# Patient Record
Sex: Female | Born: 1945 | Race: Black or African American | Hispanic: No | Marital: Married | Smoking: Never smoker
Health system: Southern US, Community
[De-identification: ages and names within clinical notes are randomized; demographics above are authoritative.]

## PROBLEM LIST (undated history)

## (undated) DIAGNOSIS — C539 Malignant neoplasm of cervix uteri, unspecified: Secondary | ICD-10-CM

## (undated) DIAGNOSIS — E119 Type 2 diabetes mellitus without complications: Secondary | ICD-10-CM

## (undated) DIAGNOSIS — I10 Essential (primary) hypertension: Secondary | ICD-10-CM

## (undated) DIAGNOSIS — C50919 Malignant neoplasm of unspecified site of unspecified female breast: Secondary | ICD-10-CM

## (undated) HISTORY — PX: MASTECTOMY: SHX3

## (undated) HISTORY — DX: Type 2 diabetes mellitus without complications: E11.9

## (undated) HISTORY — DX: Malignant neoplasm of cervix uteri, unspecified: C53.9

## (undated) HISTORY — DX: Essential (primary) hypertension: I10

## (undated) HISTORY — DX: Malignant neoplasm of unspecified site of unspecified female breast: C50.919

## (undated) HISTORY — DX: Morbid (severe) obesity due to excess calories: E66.01

---

## 2014-08-08 ENCOUNTER — Encounter: Payer: Self-pay | Admitting: Gynecologic Oncology

## 2014-08-08 ENCOUNTER — Ambulatory Visit: Payer: Commercial Managed Care - HMO | Attending: Gynecologic Oncology | Admitting: Gynecologic Oncology

## 2014-08-08 VITALS — BP 169/78 | HR 78 | Temp 98.2°F | Resp 22 | Ht 63.0 in | Wt 262.1 lb

## 2014-08-08 DIAGNOSIS — C50919 Malignant neoplasm of unspecified site of unspecified female breast: Secondary | ICD-10-CM | POA: Diagnosis not present

## 2014-08-08 DIAGNOSIS — Z794 Long term (current) use of insulin: Secondary | ICD-10-CM | POA: Diagnosis not present

## 2014-08-08 DIAGNOSIS — Z6841 Body Mass Index (BMI) 40.0 and over, adult: Secondary | ICD-10-CM | POA: Insufficient documentation

## 2014-08-08 DIAGNOSIS — C539 Malignant neoplasm of cervix uteri, unspecified: Secondary | ICD-10-CM | POA: Insufficient documentation

## 2014-08-08 DIAGNOSIS — Z9010 Acquired absence of unspecified breast and nipple: Secondary | ICD-10-CM | POA: Insufficient documentation

## 2014-08-08 DIAGNOSIS — C50912 Malignant neoplasm of unspecified site of left female breast: Secondary | ICD-10-CM

## 2014-08-08 DIAGNOSIS — C7982 Secondary malignant neoplasm of genital organs: Secondary | ICD-10-CM | POA: Insufficient documentation

## 2014-08-08 DIAGNOSIS — E119 Type 2 diabetes mellitus without complications: Secondary | ICD-10-CM | POA: Diagnosis not present

## 2014-08-08 DIAGNOSIS — I10 Essential (primary) hypertension: Secondary | ICD-10-CM | POA: Insufficient documentation

## 2014-08-08 DIAGNOSIS — Z7982 Long term (current) use of aspirin: Secondary | ICD-10-CM | POA: Insufficient documentation

## 2014-08-08 NOTE — Progress Notes (Signed)
Consult Note: Gyn-Onc  Consult was requested by Dr. Dion Body for the evaluation of Kim Valenzuela 69 y.o. female with cervical squamous cell carcinoma.  CC:  Chief Complaint  Patient presents with  . Cervical Cancer    Assessment/Plan:  Kim Valenzuela  is a 69 y.o.  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  I believe that Kim Valenzuela is too obese to be a good candidate for a radical hysterectomy procedure. I discussed with her that same possibility for cure can be obtained from primary chemoradiation. We discussed the likely course of external beam radiation with radiosensitizing cisplatin followed by brachytherapy. Complicating her presentation is her coexisting diagnosis of either recurrent or new second primary breast cancer. We will liase with her medical oncologist in [place] to coordinate the optimal care plan see that both of these synchronously diagnosed cancers can be treated in the optimal time.  I have ordered a PET CT to evaluate for distant metastatic disease of her cervical cancer (breast cancer).   HPI: Kim Valenzuela is a 70 year old G2 P2 who is seen in consultation at the request of Dr.Abdul-Mbacke for finding of squamous cell carcinoma on a Pap smear that was obtained on 07/18/2014. The patient reports vaginal spotting since December 2015. She is not sexually active. She denies edema, vaginal discharge, pelvic pain. Her last Pap was between 5-10 years ago. She reports no history of an abnormal Pap smear remotely in the past.  Dr.Abdul-Mbacke performed a pelvic examination which revealed some tissue extruding from the cervical os. A Pap smear was collected. Revealed squamous cell carcinoma. Of note an endometrial stripe was evaluated on ultrasound scan on 07/18/2014 and was thickened at 10 mm.   Interval History: She has been diagnosed with recurrence of her breast cancer/new primary and is seeing oncologists in  [place].  Current Meds:  Outpatient Encounter Prescriptions as of 08/08/2014  Medication Sig  . amLODipine (NORVASC) 10 MG tablet Take 10 mg by mouth daily.  Marland Kitchen aspirin 81 MG tablet Take 81 mg by mouth daily.  . carvedilol (COREG) 6.25 MG tablet Take 6.25 mg by mouth 2 (two) times daily with a meal.  . ergocalciferol (VITAMIN D2) 50000 UNITS capsule Take 50,000 Units by mouth once a week.  . ferrous gluconate (FERGON) 240 (27 FE) MG tablet Take 240 mg by mouth daily.  . furosemide (LASIX) 20 MG tablet Take 20 mg by mouth.  . insulin lispro protamine-lispro (HUMALOG 75/25 MIX) (75-25) 100 UNIT/ML SUSP injection Inject 50 Units into the skin 2 (two) times daily with a meal.  . losartan (COZAAR) 100 MG tablet Take 100 mg by mouth daily.  . nateglinide (STARLIX) 120 MG tablet Take 120 mg by mouth 3 (three) times daily with meals.  . pravastatin (PRAVACHOL) 20 MG tablet Take 20 mg by mouth daily.    Allergy: Not on File  Social Hx:   History   Social History  . Marital Status: Married    Spouse Name: N/A  . Number of Children: N/A  . Years of Education: N/A   Occupational History  . Not on file.   Social History Main Topics  . Smoking status: Never Smoker   . Smokeless tobacco: Not on file  . Alcohol Use: No  . Drug Use: No  . Sexual Activity: Not on file   Other Topics Concern  . Not on file   Social History Narrative  . No narrative on file  Past Surgical Hx:  Past Surgical History  Procedure Laterality Date  . Mastectomy    . Cesarean section      Past Medical Hx:  Past Medical History  Diagnosis Date  . Breast cancer   . Cervical cancer   . Diabetes mellitus without complication   . Hypertension   . Obesity, Class III, BMI 40-49.9 (morbid obesity)     Past Gynecological History:  No LMP recorded. cx x2  Family Hx:  Family History  Problem Relation Age of Onset  . Cancer Mother     Review of Systems:  Constitutional  Feels well,     ENT Normal appearing ears and nares bilaterally Skin/Breast  No rash, sores, jaundice, itching, dryness Cardiovascular  No chest pain, shortness of breath, or edema  Pulmonary  No cough or wheeze.  Gastro Intestinal  No nausea, vomitting, or diarrhoea. No bright red blood per rectum, no abdominal pain, change in bowel movement, or constipation.  Genito Urinary  No frequency, urgency, dysuria, + vaginal spotting Musculo Skeletal  No myalgia, arthralgia, joint swelling or pain  Neurologic  No weakness, numbness, change in gait,  Psychology  No depression, anxiety, insomnia.   Vitals:  Blood pressure 169/78, pulse 78, temperature 98.2 F (36.8 C), temperature source Oral, resp. rate 22, height 5\' 3"  (1.6 m), weight 262 lb 1.6 oz (118.888 kg).  Physical Exam: WD in NAD Neck  Supple NROM, without any enlargements.  Lymph Node Survey No cervical supraclavicular or inguinal adenopathy Cardiovascular  Pulse normal rate, regularity and rhythm. S1 and S2 normal.  Lungs  Clear to auscultation bilateraly, without wheezes/crackles/rhonchi. Good air movement.  Skin  No rash/lesions/breakdown  Psychiatry  Alert and oriented to person, place, and time  Abdomen  Normoactive bowel sounds, abdomen soft, non-tender and obese without evidence of hernia.  Back No CVA tenderness Genito Urinary  Vulva/vagina: Normal external female genitalia.  No lesions. No discharge or bleeding.  Bladder/urethra:  No lesions or masses, well supported bladder  Vagina: no lesions or abnormalities  Cervix: smooth ectocervix, however the os is open with tissue extruding - cervical biopsy taken of this tissue. The cervix itself barrel's out to a 4cm lesion. It is mobile without apparent parametrial extension.  Uterus:  Small, mobile, no parametrial involvement or nodularity.  Adnexa: no palpable masses. Rectal  Good tone, no masses no cul de sac nodularity. No parametrial/uterosacral nodularity. Extremities   No bilateral cyanosis, clubbing or edema.  Donaciano Eva, MD   08/08/2014, 4:18 PM

## 2014-08-10 NOTE — Progress Notes (Signed)
The pathology report from the endocervical biopsy was resulted today and revealed SEROUS ENDOMETRIAL CANCER.  I called Kim Valenzuela and her treating oncologist, Dr Lorn Junes [phone]) and informed her that she has a clinical stage II serous endometrial cancer.  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 Postoperatively she can then begin planned chemotherapy for her breast cancer. If we recommend adjuvant radiation based on her uterine pathology findings, that can be contemplated after her chemotherapy for her breast cancer, however, given that she will require Adriamycin for treatment of her breast cancer, there may be toxicity issues with the addition of radiation.  While her tumor may be a stage II (involving endocervix) there is no gross replacement of cervical stroma. I believe it is reasonable to attempt an extrafascial hysterectomy or modified "type II" radical hysterectomy. It will be associated with increased risks in this patient due to her morbid obesity, however I believe it is more feasible than a radical hysterectomy for this very obese patient. I am not able to accommodate her on my operating room schedule here in [place] until April, however, due to her synchronous breast cancer also requiring urgent treatment, we will pursue surgical options with my partners in [place] to expedite her therapy.  Everitt Amber, MD

## 2014-08-11 ENCOUNTER — Encounter: Payer: Self-pay | Admitting: Gynecologic Oncology

## 2014-08-11 ENCOUNTER — Ambulatory Visit (HOSPITAL_COMMUNITY): Payer: 59

## 2014-08-11 DIAGNOSIS — Z981 Arthrodesis status: Secondary | ICD-10-CM

## 2014-08-11 DIAGNOSIS — Z419 Encounter for procedure for purposes other than remedying health state, unspecified: Secondary | ICD-10-CM

## 2014-08-11 NOTE — Progress Notes (Signed)
Referral was made to [facility] for Radiation Oncology when the patient was seen in the office on Friday, Feb. 22.  Due to the biopsy results, radiation no longer recommended.  [facility] notified this am that referral needed to be cancelled.  Received a phone call back from [place] at [place] that the appt had been cancelled.

## 2014-08-11 NOTE — Progress Notes (Signed)
Spoke with Katharine Look at [facility].  Patient is scheduled for surgery with Dr. Clarene Essex at [facility] on March 4, Friday.  She is to call the patient to give her a pre-operative appt as well.

## 2014-08-16 ENCOUNTER — Encounter (HOSPITAL_COMMUNITY)
Admission: RE | Admit: 2014-08-16 | Discharge: 2014-08-16 | Disposition: A | Discharge status: Home / Self Care | Payer: 59 | Source: Ambulatory Visit | Attending: Gynecologic Oncology | Admitting: Gynecologic Oncology

## 2014-08-16 DIAGNOSIS — C539 Malignant neoplasm of cervix uteri, unspecified: Secondary | ICD-10-CM | POA: Insufficient documentation

## 2014-08-17 ENCOUNTER — Ambulatory Visit: Payer: 59

## 2014-08-17 ENCOUNTER — Encounter (HOSPITAL_COMMUNITY)
Admission: RE | Admit: 2014-08-17 | Discharge: 2014-08-17 | Disposition: A | Discharge status: Home / Self Care | Payer: Commercial Managed Care - HMO | Source: Ambulatory Visit | Attending: Gynecologic Oncology | Admitting: Gynecologic Oncology

## 2014-08-17 ENCOUNTER — Ambulatory Visit: Admission: RE | Admit: 2014-08-17 | Payer: 59 | Source: Ambulatory Visit | Admitting: Radiation Oncology

## 2014-08-17 DIAGNOSIS — C539 Malignant neoplasm of cervix uteri, unspecified: Secondary | ICD-10-CM | POA: Insufficient documentation

## 2014-08-17 LAB — GLUCOSE, CAPILLARY: Glucose-Capillary: 121 mg/dL — ABNORMAL HIGH (ref 70–99)

## 2014-08-17 MED ORDER — FLUDEOXYGLUCOSE F - 18 (FDG) INJECTION
11.9300 | Freq: Once | INTRAVENOUS | Status: AC | PRN
  Administered 2014-08-17: 11.93 via INTRAVENOUS

## 2014-09-07 ENCOUNTER — Ambulatory Visit: Payer: Self-pay | Admitting: Gynecologic Oncology

## 2014-09-22 ENCOUNTER — Telehealth: Payer: Self-pay | Admitting: *Deleted

## 2014-09-22 NOTE — Telephone Encounter (Signed)
Notified pt of scheduled appointment on 10/03/2014 @ 12:00 with Dr. Denman George. Pt agreed with time and date

## 2014-10-03 ENCOUNTER — Ambulatory Visit: Payer: Medicare PPO | Attending: Gynecologic Oncology | Admitting: Gynecologic Oncology

## 2014-10-03 ENCOUNTER — Encounter: Payer: Self-pay | Admitting: Gynecologic Oncology

## 2014-10-03 VITALS — BP 165/71 | HR 89 | Temp 98.2°F | Resp 16 | Ht 63.0 in | Wt 250.5 lb

## 2014-10-03 DIAGNOSIS — C50919 Malignant neoplasm of unspecified site of unspecified female breast: Secondary | ICD-10-CM | POA: Diagnosis not present

## 2014-10-03 DIAGNOSIS — C541 Malignant neoplasm of endometrium: Secondary | ICD-10-CM | POA: Insufficient documentation

## 2014-10-03 DIAGNOSIS — C539 Malignant neoplasm of cervix uteri, unspecified: Secondary | ICD-10-CM

## 2014-10-03 NOTE — Progress Notes (Signed)
POSTOPERATIVE FOLLOWUP  Assessment:    69 y.o. 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   S/p robotic hysterectomy, BSO on 08/18/14 with Dr Cindie Laroche. no LVSI, 27% myometrial invasion,  lymph nodes were not assessed surgically secondary to morbid obesity however were clinically negative on PET/CT from 08/17/14. She also has a synchronous breast cancer.   Plan: 1) Pathology reports reviewed today 2) Treatment counseling - I discussed with the patient that the formal recommendation for her high grade and high risk cancer were to have combination adjuvant therapy with chemotherapy (with at least 6 cycles of carboplatin and paclitaxel) and radiation of the whole pelvis (for her stage II, high grade disease). I discussed that there is evidence to suggest such interventions are associated with decreased risk for locoregional recurrence but not necessarily improved survival.  We will followup with locating records from Dr Lorn Junes regarding which chemotherapy she is receiving for her neoadjuvant breast chemotherapy. If she could tolerate a break from chemotherapy at around the 3 month mark and be considered for whole pelvic RT, this would help with pelvic loco-regional control. However, if such interruption in therapy would be detrimental to her breast cancer treatment, then she should be treated with chemotherapy alone.    3)  Return to clinic for 3 monthly surveillance visits for her endometrial cancer.  HPI:  Kim Valenzuela is a 69 y.o. year old initially seen in consultation on 08/10/14 for carcinoma on a cervical biopsy. It was intially felt to be cervical cancer, however, pathology confirmed high grade serous endometrial cancer. PET/CT scan on 08/17/14 confirmed an abnormal endometrium. She also had a synchronous, locally advanced breast cancer diagnosed and confirmed on that PET.  She then underwent a robotic hysterectomy, BSO on 07/20/53 without complications.  Her  postoperative course was uncomplicated.  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  She is seen today for a postoperative check and to discuss her pathology results and treatment plan.  Since discharge from the hospital, she is feeling well.  She has improving appetite, normal bowel and bladder function, and pain controlled with minimal PO medication. She has no other complaints today.    Review of systems: Constitutional:  She has no weight gain or weight loss. She has no fever or chills. Eyes: No blurred vision Ears, Nose, Mouth, Throat: No dizziness, headaches or changes in hearing. No mouth sores. Cardiovascular: No chest pain, palpitations or edema. Respiratory:  No shortness of breath, wheezing or cough Gastrointestinal: She has normal bowel movements without diarrhea or constipation. She denies any nausea or vomiting. She denies blood in her stool or heart burn. Genitourinary:  She denies pelvic pain, pelvic pressure or changes in her urinary function. She has no hematuria, dysuria, or incontinence. She has no irregular vaginal bleeding or vaginal discharge Musculoskeletal: Denies muscle weakness or joint pains.  Skin:  She has no skin changes, rashes or itching Neurological:  Denies dizziness or headaches. No neuropathy, no numbness or tingling. Psychiatric:  She denies depression or anxiety. Hematologic/Lymphatic:   No easy bruising or bleeding   Physical Exam: Blood pressure 165/71, pulse 89, temperature 98.2 F (36.8 C), temperature source Oral, resp. rate 16, height 5\' 3"  (1.6 m), weight 250 lb 8 oz (113.626 kg). General: Well dressed, well nourished in no apparent distress.   HEENT:  Normocephalic  and atraumatic, no lesions.  Extraocular muscles intact. Sclerae anicteric. Pupils equal, round, reactive. No mouth sores or ulcers.  Thyroid is normal size, not nodular, midline. Skin:  No lesions or rashes. Breasts:  deferred Lungs:  Clear to auscultation bilaterally.  No wheezes. Cardiovascular:  Regular rate and rhythm.  No murmurs or rubs. Abdomen:  Soft, nontender, nondistended.  No palpable masses.  No hepatosplenomegaly.  No ascites. Normal bowel sounds.  No hernias.  Incisions are well healed Genitourinary: Normal EGBUS  Vaginal cuff intact.  No bleeding or discharge.  No cul de sac fullness. Extremities: No cyanosis, clubbing or edema.  No calf tenderness or erythema. No palpable cords. Psychiatric: Mood and affect are appropriate. Neurological: Awake, alert and oriented x 3. Sensation is intact, no neuropathy.  Musculoskeletal: No pain, normal strength and range of motion.  Donaciano Eva, MD

## 2014-10-03 NOTE — Patient Instructions (Signed)
Dr. Denman George would like to see you in our office the week after you complete your chemotherapy. If the date we have given you is during treatment, please call to reschedule to the week after you finish therapy. Please call our clinic with any questions or concerns.

## 2015-01-30 ENCOUNTER — Ambulatory Visit: Payer: 59 | Admitting: Gynecologic Oncology

## 2015-06-21 ENCOUNTER — Telehealth: Payer: Self-pay

## 2015-06-21 NOTE — Telephone Encounter (Signed)
Call received from [facility] ( fax : [phone] ) Dr Debby Bud nurse , requesting Dr Terrence Dupont Rossi's last note from patient visit on 10/03/2014 . Documents generated and faxed to [phone] , confirmation report received back OK.

## 2016-01-31 IMAGING — PT NM PET TUM IMG INITIAL (PI) SKULL BASE T - THIGH
1 of 8 series · 1 of 25 positions shown · non-contrast
Comparison: No priors.

CLINICAL DATA: Initial treatment strategy for cervical carcinoma.

EXAM:
NUCLEAR MEDICINE PET SKULL BASE TO THIGH
TECHNIQUE: 11.9 mCi F-18 FDG was injected intravenously. Full-ring PET imaging
was performed from the skull base to thigh after the radiotracer. CT
data was obtained and used for attenuation correction and anatomic
localization.
FASTING BLOOD GLUCOSE:  Value: 121 mg/dl

[Series 3: pet sk_thigh ac · axial · 5.0mm · 4.07mm/px · 1 of 203 slices shown]
[im 102/203]
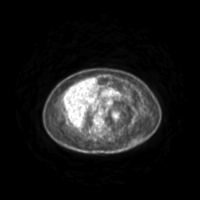

[1 of 25 positions shown; findings below may reference images not displayed]

FINDINGS: NECK

No hypermetabolic lymph nodes in the neck. There is a small amount
of low-level hypermetabolic activity in the lower right strap
musculature, without a corresponding mass on CT imaging, presumably
physiologic.

CHEST

Status post right modified radical mastectomy and right axillary
nodal dissection. In the lateral aspect of the left breast there is
a 3.4 x 4.8 cm hypermetabolic (SUVmax = 21.9)mass, highly concerning
for primary breast cancer. Associated with this there are multiple
borderline enlarged but hypermetabolic left axillary lymph nodes
measuring up to 9 mm in short axis (SUVmax = 3.0-5.3), compatible
with local nodal metastases. No internal mammary adenopathy. No
hypermetabolic mediastinal or hilar nodes. No suspicious pulmonary
nodules on the CT scan. However, there is a very unusual appearance
of the lung parenchyma with what appears to be relatively diffuse
ground-glass attenuation and some probable peribronchovascular
micronodularity (poorly evaluated on today's non breath held
examination) which has an upper lung predominance, with sparing of
the subpleural peripheral lungs, as well as lung bases bilaterally.
Linear scarring in the periphery of the inferior segment of the
lingula. Mild cardiomegaly. There is atherosclerosis of the thoracic
aorta, the great vessels of the mediastinum and the coronary
arteries, including calcified atherosclerotic plaque in the left
anterior descending and left circumflex coronary arteries. Left
subclavian single-lumen porta cath with tip terminating in the
distal superior vena cava.

ABDOMEN/PELVIS

There is diffuse hypermetabolism throughout the uterus, which
appears centered throughout the endometrial canal extending from the
level the fundus to the level of the cervix (SUVmax = 14.0-21.4). No
surrounding pelvic lymphadenopathy. No significant volume of
ascites. No definite peritoneal implants or peritoneal
hypermetabolism.

No abnormal hypermetabolic activity within the liver, pancreas,
adrenal glands, or spleen. No hypermetabolic lymph nodes in the
abdomen. Normal appendix. 1.4 x 1.8 cm mixed attenuation lesion in
the anterior aspect of the upper pole of the right kidney (image 110
of series 4) has some internal fatty attenuation, and is presumably
an angiomyolipoma. Nonobstructive calculi in the lower pole
collecting system of the left kidney measuring up to 4 mm. No
ureteral stones or findings of urinary tract obstruction are noted
at this time.

SKELETON

No focal hypermetabolic activity to suggest skeletal metastasis.
IMPRESSION: 1. Diffuse hypermetabolism throughout the uterus extending from the
level of fundus to the level of the cervix. Given the positive
endocervical biopsy, findings could certainly represent cervical
cancer, however, the appearance is more suggestive of endometrial
carcinoma. Clinical correlation is suggested.
2. In addition, there is a large mass in the left breast measuring
3.4 x 4.8 cm, concerning for primary left-sided breast cancer with
multiple borderline enlarged but hypermetabolic left axillary lymph
nodes which are compatible with local nodal spread of disease.
3. No evidence of distant metastatic disease elsewhere in the neck,
chest, abdomen or pelvis.
4. Very unusual appearance of the lung parenchyma, as discussed
above. This is poorly evaluated on today's non breath held
examination, but is concerning for potential interstitial lung
disease such as hypersensitivity pneumonitis. Followup nonemergent
high-resolution chest CT is recommended in the near future to better
evaluate these findings.
5. Atherosclerosis, including two vessel coronary artery disease.
Assessment for potential risk factor modification, dietary therapy
or pharmacologic therapy may be warranted, if clinically indicated.
6. Mild cardiomegaly.
7. Status post right modified radical mastectomy with right axillary
nodal dissection.
8. Probable small 1.4 x 1.8 cm right renal angiomyolipoma
incidentally noted.
9. Additional incidental findings, as above.

## 2016-08-15 DEATH — deceased
